# Patient Record
Sex: Female | Born: 1980 | Race: Black or African American | Hispanic: No | Marital: Single | Smoking: Never smoker
Health system: Southern US, Community
[De-identification: ages and names within clinical notes are randomized; demographics above are authoritative.]

## PROBLEM LIST (undated history)

## (undated) DIAGNOSIS — M549 Dorsalgia, unspecified: Secondary | ICD-10-CM

## (undated) HISTORY — PX: TUBAL LIGATION: SHX77

---

## 2017-02-12 ENCOUNTER — Emergency Department (HOSPITAL_BASED_OUTPATIENT_CLINIC_OR_DEPARTMENT_OTHER)
Admission: EM | Admit: 2017-02-12 | Discharge: 2017-02-12 | Disposition: A | Discharge status: Home / Self Care | Payer: Medicaid Other | Attending: Emergency Medicine | Admitting: Emergency Medicine

## 2017-02-12 ENCOUNTER — Emergency Department (HOSPITAL_BASED_OUTPATIENT_CLINIC_OR_DEPARTMENT_OTHER): Payer: Medicaid Other

## 2017-02-12 ENCOUNTER — Encounter (HOSPITAL_BASED_OUTPATIENT_CLINIC_OR_DEPARTMENT_OTHER): Payer: Self-pay | Admitting: Adult Health

## 2017-02-12 DIAGNOSIS — M5442 Lumbago with sciatica, left side: Secondary | ICD-10-CM | POA: Diagnosis not present

## 2017-02-12 DIAGNOSIS — M545 Low back pain: Secondary | ICD-10-CM | POA: Diagnosis present

## 2017-02-12 DIAGNOSIS — M5441 Lumbago with sciatica, right side: Secondary | ICD-10-CM | POA: Insufficient documentation

## 2017-02-12 DIAGNOSIS — N390 Urinary tract infection, site not specified: Secondary | ICD-10-CM | POA: Insufficient documentation

## 2017-02-12 HISTORY — DX: Dorsalgia, unspecified: M54.9

## 2017-02-12 LAB — URINALYSIS, ROUTINE W REFLEX MICROSCOPIC
Bilirubin Urine: NEGATIVE
GLUCOSE, UA: NEGATIVE mg/dL
HGB URINE DIPSTICK: NEGATIVE
Ketones, ur: NEGATIVE mg/dL
Nitrite: NEGATIVE
PROTEIN: NEGATIVE mg/dL
Specific Gravity, Urine: 1.018 (ref 1.005–1.030)
pH: 5.5 (ref 5.0–8.0)

## 2017-02-12 LAB — URINALYSIS, MICROSCOPIC (REFLEX)

## 2017-02-12 LAB — PREGNANCY, URINE: Preg Test, Ur: NEGATIVE

## 2017-02-12 MED ORDER — CEPHALEXIN 500 MG PO CAPS: 500.0000 mg | ORAL_CAPSULE | Freq: Two times a day (BID) | ORAL | 0 refills | Status: DC

## 2017-02-12 MED ORDER — METHYLPREDNISOLONE SODIUM SUCC 125 MG IJ SOLR
80.0000 mg | Freq: Once | INTRAMUSCULAR | Status: AC
  Administered 2017-02-12: 80 mg via INTRAMUSCULAR
  Filled 2017-02-12: qty 2

## 2017-02-12 MED ORDER — METHOCARBAMOL 500 MG PO TABS: 500.0000 mg | ORAL_TABLET | Freq: Two times a day (BID) | ORAL | 0 refills | Status: AC

## 2017-02-12 MED ORDER — CEPHALEXIN 500 MG PO CAPS: 500.0000 mg | ORAL_CAPSULE | Freq: Two times a day (BID) | ORAL | 0 refills | Status: AC

## 2017-02-12 MED ORDER — ACETAMINOPHEN 500 MG PO TABS: 500.0000 mg | ORAL_TABLET | Freq: Four times a day (QID) | ORAL | 0 refills | Status: AC | PRN

## 2017-02-12 MED ORDER — PREDNISONE 20 MG PO TABS: 40.0000 mg | ORAL_TABLET | Freq: Every day | ORAL | 0 refills | Status: AC

## 2017-02-12 MED ORDER — IBUPROFEN 600 MG PO TABS: 600.0000 mg | ORAL_TABLET | Freq: Four times a day (QID) | ORAL | 0 refills | Status: AC | PRN

## 2017-02-12 MED ORDER — KETOROLAC TROMETHAMINE 30 MG/ML IJ SOLN
30.0000 mg | Freq: Once | INTRAMUSCULAR | Status: AC
  Administered 2017-02-12: 30 mg via INTRAMUSCULAR
  Filled 2017-02-12: qty 1

## 2017-02-12 MED FILL — MAPAP 500 MG TABLET: 500 | 25 days supply | Qty: 100 | Fill #0

## 2017-02-12 MED FILL — IBUPROFEN 600 MG TABLET: 600 | 8 days supply | Qty: 30 | Fill #0

## 2017-02-12 MED FILL — predniSONE 20 MG TABS: 20 | 4 days supply | Qty: 8 | Fill #0

## 2017-02-12 MED FILL — METHOCARBAMOL 500 MG TABLET: 500 | 10 days supply | Qty: 20 | Fill #0

## 2017-02-12 MED FILL — CEPHALEXIN 500 MG CAPSULE: 500 | 7 days supply | Qty: 14 | Fill #0

## 2017-02-12 NOTE — ED Triage Notes (Addendum)
PResents with onset of back pain in lower back and into both buttocks. LASt night reports she was fine and had her normal back ache that hurts a little is worse in the AM but once getting moving it is better, this AM the pain was severe and she was unable to walk with out severe pain in both hips, lower back and buttocks. Denies numbness and tingling, pain is described as "someone took my body and snapped it" denies loss of bowel and bladder.  ENdorses frequent urination.

## 2017-02-12 NOTE — ED Notes (Signed)
Patient transported to X-ray 

## 2017-02-12 NOTE — ED Notes (Signed)
ED Provider at bedside. 

## 2017-02-12 NOTE — ED Provider Notes (Signed)
[facility] Provider [facility]   CSN: [id number] Arrival date & time: 02/12/17  1401     History   Chief Complaint Chief Complaint  Patient presents with  . Back Pain    HPI Brianna Floyd is a 36 y.o. female with history of mild intermittent back pain who presents with severe back pain and began this morning. Patient reports bilateral and midline low back pain with radiation to her hips and pain radiation down the back of her legs when she walks. Patient where she could not walk this morning due to the severe pain. However, patient did ambulate in ED. Patient also reports urinary frequency and urgency over the past few months. She denies loss of bowel or bladder control or saddle anesthesia, however. She also denies any fevers, weight loss, history of procedure to back, history of IVDU, cancer, or any other concerning factor. She denies any numbness or tingling, chest pain, shortness of breath, abdominal pain, nausea, vomiting. Patient states she normally takes Tylenol for her back pain, but did not take anything today prior to arrival.  HPI  Past Medical History:  Diagnosis Date  . Back ache     There are no active problems to display for this patient.   History reviewed. No pertinent surgical history.  OB History    No data available       Home Medications    Prior to Admission medications   Medication Sig Start Date End Date Taking? Authorizing Provider  acetaminophen (TYLENOL) 500 MG tablet Take 1 tablet (500 mg total) by mouth every 6 (six) hours as needed. 02/12/17   Medrith Veillon, Waylan BogaAlexandra M, PA-C  cephALEXin (KEFLEX) 500 MG capsule Take 1 capsule (500 mg total) by mouth 2 (two) times daily. 02/12/17   Oyuki Hogan, Waylan BogaAlexandra M, PA-C  ibuprofen (ADVIL,MOTRIN) 600 MG tablet Take 1 tablet (600 mg total) by mouth every 6 (six) hours as needed. 02/12/17   Odena Mcquaid, Waylan BogaAlexandra M, PA-C  methocarbamol (ROBAXIN) 500 MG tablet Take 1 tablet (500 mg total) by mouth 2 (two) times daily. 02/12/17    Johathon Overturf, Waylan BogaAlexandra M, PA-C  predniSONE (DELTASONE) 20 MG tablet Take 2 tablets (40 mg total) by mouth daily with breakfast. 02/12/17   Emi HolesLaw, Addi Pak M, PA-C    Family History History reviewed. No pertinent family history.  Social History Social History  Substance Use Topics  . Smoking status: Never Smoker  . Smokeless tobacco: Never Used  . Alcohol use No     Allergies   Patient has no known allergies.   Review of Systems Review of Systems  Constitutional: Negative for chills and fever.  HENT: Negative for facial swelling and sore throat.   Respiratory: Negative for shortness of breath.   Cardiovascular: Negative for chest pain.  Gastrointestinal: Negative for abdominal pain, nausea and vomiting.  Genitourinary: Positive for frequency and urgency. Negative for dysuria.  Musculoskeletal: Positive for back pain.  Skin: Negative for rash and wound.  Neurological: Negative for numbness and headaches.  Psychiatric/Behavioral: The patient is not nervous/anxious.      Physical Exam Updated Vital Signs BP 134/75 (BP Location: Right Arm)   Pulse 100   Temp 98.3 F (36.8 C) (Oral)   Resp 20   Ht 5\' 5"  (1.651 m)   Wt 90.7 kg (200 lb)   LMP 01/15/2017 (Approximate)   SpO2 100%   BMI 33.28 kg/m   Physical Exam  Constitutional: She appears well-developed and well-nourished. No distress.  HENT:  Head: Normocephalic and atraumatic.  Mouth/Throat: Oropharynx is clear and moist. No oropharyngeal exudate.  Eyes: Conjunctivae are normal. Pupils are equal, round, and reactive to light. Right eye exhibits no discharge. Left eye exhibits no discharge. No scleral icterus.  Neck: Normal range of motion. Neck supple. No thyromegaly present.  Cardiovascular: Normal rate, regular rhythm, normal heart sounds and intact distal pulses.  Exam reveals no gallop and no friction rub.   No murmur heard. Pulmonary/Chest: Effort normal and breath sounds normal. No stridor. No respiratory distress. She  has no wheezes. She has no rales.  Abdominal: Soft. Bowel sounds are normal. She exhibits no distension. There is no tenderness. There is no rebound and no guarding.  Musculoskeletal: She exhibits no edema.       Cervical back: She exhibits no tenderness and no bony tenderness.       Thoracic back: She exhibits no tenderness and no bony tenderness.       Lumbar back: She exhibits tenderness and bony tenderness.       Back:  Positive straight leg raise, worse on the right  Lymphadenopathy:    She has no cervical adenopathy.  Neurological: She is alert. Coordination normal.  5/5 strength to lower extremities; normal sensation  Skin: Skin is warm and dry. No rash noted. She is not diaphoretic. No pallor.  Psychiatric: She has a normal mood and affect.  Nursing note and vitals reviewed.    ED Treatments / Results  Labs (all labs ordered are listed, but only abnormal results are displayed) Labs Reviewed  URINALYSIS, ROUTINE W REFLEX MICROSCOPIC - Abnormal; Notable for the following:       Result Value   APPearance CLOUDY (*)    Leukocytes, UA SMALL (*)    All other components within normal limits  URINALYSIS, MICROSCOPIC (REFLEX) - Abnormal; Notable for the following:    Bacteria, UA MANY (*)    Squamous Epithelial / LPF 0-5 (*)    All other components within normal limits  URINE CULTURE  PREGNANCY, URINE    EKG  EKG Interpretation None       Radiology Dg Lumbar Spine Complete  Result Date: 02/12/2017 CLINICAL DATA:  Low back pain common no known injury, initial encounter EXAM: LUMBAR SPINE - COMPLETE 4+ VIEW COMPARISON:  None. FINDINGS: Five lumbar type vertebral bodies are well visualized. Vertebral body height is well maintained. No compression fractures are seen. No anterolisthesis is noted. No soft tissue changes are seen. IMPRESSION: No acute abnormality noted. Electronically Signed   By: Alcide Clever M.D.   On: 02/12/2017 15:01    Procedures Procedures (including  critical care time)  Medications Ordered in ED Medications  ketorolac (TORADOL) 30 MG/ML injection 30 mg (30 mg Intramuscular Given 02/12/17 1458)  methylPREDNISolone sodium succinate (SOLU-MEDROL) 125 mg/2 mL injection 80 mg (80 mg Intramuscular Given 02/12/17 1458)     Initial Impression / Assessment and Plan / ED Course  I have reviewed the triage vital signs and the nursing notes.  Pertinent labs & imaging results that were available during my care of the patient were reviewed by me and considered in my medical decision making (see chart for details).     Patient with back pain.  Lumbar x-ray shows no acute abnormality. No neurological deficits and normal neuro exam.  Patient is ambulatory.  No loss of bowel or bladder control.  No concern for cauda equina.  No fever, night sweats, weight loss, h/o cancer, IVDA, no recent procedure to back. Patient with small leukocytes, many  bacteria, and urinary frequency. We'll treat with Keflex. Urine culture sent. Patient's back pain improved significantly with IM Toradol and Solu-Medrol in the ED. Supportive care, including ibuprofen, Robaxin, 4 day burst of prednisone, and return precaution discussed. Appears safe for discharge at this time. Follow up with Dr. Pearletha Forge as indicated in discharge paperwork.    Final Clinical Impressions(s) / ED Diagnoses   Final diagnoses:  Acute bilateral low back pain with bilateral sciatica  Lower urinary tract infectious disease    New Prescriptions Discharge Medication List as of 02/12/2017  4:32 PM    START taking these medications   Details  acetaminophen (TYLENOL) 500 MG tablet Take 1 tablet (500 mg total) by mouth every 6 (six) hours as needed., Starting Tue 02/12/2017, Print    ibuprofen (ADVIL,MOTRIN) 600 MG tablet Take 1 tablet (600 mg total) by mouth every 6 (six) hours as needed., Starting Tue 02/12/2017, Print    methocarbamol (ROBAXIN) 500 MG tablet Take 1 tablet (500 mg total) by mouth 2 (two) times  daily., Starting Tue 02/12/2017, Print    predniSONE (DELTASONE) 20 MG tablet Take 2 tablets (40 mg total) by mouth daily with breakfast., Starting Tue 02/12/2017, Print    cephALEXin (KEFLEX) 500 MG capsule Take 1 capsule (500 mg total) by mouth 2 (two) times daily., Starting Tue 02/12/2017, Print         Machelle Raybon, [place], PA-C 02/12/17 1742    Benjiman Core, MD 02/13/17 [phone]

## 2017-02-12 NOTE — Discharge Instructions (Addendum)
Medications: Prednisone, Robaxin, ibuprofen, Tylenol, Keflex  Treatment: Take prednisone once daily for 4 days. Take Robaxin twice daily as needed for muscle pain and spasms. Do not drive or operate machinery while taking Robaxin. Alternate ibuprofen and Tylenol every 3 hours or take 1 of the 2 every 6 hours. Take Keflex twice daily for 7 days for suspected urinary tract infection. Use ice 3-4 times daily alternating 20 minutes on, 20 minutes off. You also alternate with heating pad.  Follow-up: Please follow-up with Dr. Pearletha ForgeHudnall, a sports medicine doctor, for further evaluation and treatment of your back pain. Please return to emergency department if you develop any new or worsening symptoms.

## 2017-02-13 LAB — URINE CULTURE: Special Requests: NORMAL

## 2017-05-05 ENCOUNTER — Emergency Department (HOSPITAL_BASED_OUTPATIENT_CLINIC_OR_DEPARTMENT_OTHER)
Admission: EM | Admit: 2017-05-05 | Discharge: 2017-05-05 | Disposition: A | Discharge status: Home / Self Care | Payer: Medicaid Other | Attending: Physician Assistant | Admitting: Physician Assistant

## 2017-05-05 ENCOUNTER — Encounter (HOSPITAL_BASED_OUTPATIENT_CLINIC_OR_DEPARTMENT_OTHER): Payer: Self-pay | Admitting: Emergency Medicine

## 2017-05-05 DIAGNOSIS — K047 Periapical abscess without sinus: Secondary | ICD-10-CM | POA: Diagnosis not present

## 2017-05-05 DIAGNOSIS — K0889 Other specified disorders of teeth and supporting structures: Secondary | ICD-10-CM | POA: Diagnosis present

## 2017-05-05 DIAGNOSIS — Z79899 Other long term (current) drug therapy: Secondary | ICD-10-CM | POA: Insufficient documentation

## 2017-05-05 MED ORDER — TRAMADOL HCL 50 MG PO TABS: 50.0000 mg | ORAL_TABLET | Freq: Four times a day (QID) | ORAL | 0 refills | Status: AC | PRN

## 2017-05-05 MED ORDER — BENZOCAINE 20 % MT AERO
INHALATION_SPRAY | OROMUCOSAL | Status: AC
  Filled 2017-05-05: qty 57

## 2017-05-05 MED ORDER — CLINDAMYCIN HCL 300 MG PO CAPS: 300.0000 mg | ORAL_CAPSULE | Freq: Three times a day (TID) | ORAL | 0 refills | Status: AC

## 2017-05-05 MED ORDER — LIDOCAINE VISCOUS 2 % MT SOLN: 20.0000 mL | OROMUCOSAL | 0 refills | Status: AC | PRN

## 2017-05-05 NOTE — ED Notes (Signed)
ED Provider at bedside. 

## 2017-05-05 NOTE — ED Notes (Signed)
Left upper tooth broke off a month ago and it did not bother her.  Gums to affected site is slightly swollen.  Patient claimed that it also hurts her left side of her head.

## 2017-05-05 NOTE — Discharge Instructions (Signed)
You do have a dental abscess. Please take the antibiotic.Follow up with a dentist. Motrin and tylenol for pain. Use the viscous lidocaine. Have given you small amount of tramadol to take for refractory pain. REturn to the Ed if symptoms worsen.

## 2017-05-05 NOTE — ED Triage Notes (Signed)
Pt c/o dental pain x 2 days; sts broken tooth on LT upper gum now has an abscess.

## 2017-05-06 NOTE — ED Provider Notes (Signed)
[facility]-EMER[facility]ENCY DEPT Provider Note   CSN: [id number] Arrival date & time: 05/05/17  1425     History   Chief Complaint Chief Complaint  Patient presents with  . Dental Pain    HPI Brianna Floyd is a 36 y.o. female.  HPI 36 year old [place] American female presents to the ED with complaints of tooth abscess. Patient states that her left upper tooth broke off one month ago. At that time it was not bothering her. Patient states that since then she has developed swelling to the area and significant pain. States the pain radiates to the left side of her face. Patient denies any associated difficulty swallowing, difficulties breathing, fevers, facial swelling. Patient follow-up with a dentist yet. She denies any associated lightheadedness, dizziness, vision changes, neck pain. Patient has not tried anything for her symptoms prior to arrival. Nothing makes better. Eating and drinking makes the pain worse. Past Medical History:  Diagnosis Date  . Back ache     There are no active problems to display for this patient.   Past Surgical History:  Procedure Laterality Date  . TUBAL LIGATION      OB History    No data available       Home Medications    Prior to Admission medications   Medication Sig Start Date End Date Taking? Authorizing Provider  acetaminophen (TYLENOL) 500 MG tablet Take 1 tablet (500 mg total) by mouth every 6 (six) hours as needed. 02/12/17   Law, Waylan Boga, PA-C  cephALEXin (KEFLEX) 500 MG capsule Take 1 capsule (500 mg total) by mouth 2 (two) times daily. 02/12/17   Law, Waylan Boga, PA-C  clindamycin (CLEOCIN) 300 MG capsule Take 1 capsule (300 mg total) by mouth 3 (three) times daily. 05/05/17   Rise Mu, PA-C  ibuprofen (ADVIL,MOTRIN) 600 MG tablet Take 1 tablet (600 mg total) by mouth every 6 (six) hours as needed. 02/12/17   Law, Waylan Boga, PA-C  lidocaine (XYLOCAINE) 2 % solution Use as directed 20 mLs in the mouth or throat as needed for  mouth pain. 05/05/17   Rise Mu, PA-C  methocarbamol (ROBAXIN) 500 MG tablet Take 1 tablet (500 mg total) by mouth 2 (two) times daily. 02/12/17   Law, Waylan Boga, PA-C  predniSONE (DELTASONE) 20 MG tablet Take 2 tablets (40 mg total) by mouth daily with breakfast. 02/12/17   Law, Waylan Boga, PA-C  traMADol (ULTRAM) 50 MG tablet Take 1 tablet (50 mg total) by mouth every 6 (six) hours as needed. 05/05/17   Rise Mu, PA-C    Family History No family history on file.  Social History Social History  Substance Use Topics  . Smoking status: Never Smoker  . Smokeless tobacco: Never Used  . Alcohol use No     Allergies   Patient has no known allergies.   Review of Systems Review of Systems  Constitutional: Negative for chills and fever.  HENT: Negative for facial swelling and trouble swallowing.   Eyes: Negative for visual disturbance.  Gastrointestinal: Negative for nausea and vomiting.  Musculoskeletal: Negative for neck pain and neck stiffness.  Skin: Negative for color change.  Neurological: Positive for headaches. Negative for dizziness, weakness, light-headedness and numbness.     Physical Exam Updated Vital Signs BP (!) 136/55 (BP Location: Left Arm)   Pulse 90   Temp 98.5 F (36.9 C) (Oral)   Resp 18   LMP 04/14/2017   SpO2 100%   Physical Exam  Constitutional: She appears  well-developed and well-nourished. No distress.  HENT:  Head: Normocephalic and atraumatic.  Mouth/Throat: Uvula is midline, oropharynx is clear and moist and mucous membranes are normal. No trismus in the jaw. No uvula swelling.    No facial swelling present. Oropharynx is clear. No sublingual or submandibular swelling. Speaking complete sentences, managing her secretions, maintaining her airway.  Eyes: Right eye exhibits no discharge. Left eye exhibits no discharge. No scleral icterus.  Neck: Normal range of motion. Neck supple. No tracheal deviation present.    Pulmonary/Chest: No respiratory distress.  Musculoskeletal: Normal range of motion.  Lymphadenopathy:    She has no cervical adenopathy.  Neurological: She is alert.  Skin: No pallor.  Psychiatric: Her behavior is normal. Judgment and thought content normal.  Nursing note and vitals reviewed.    ED Treatments / Results  Labs (all labs ordered are listed, but only abnormal results are displayed) Labs Reviewed - No data to display  EKG  EKG Interpretation None       Radiology No results found.  Procedures .Marland KitchenIncision and Drainage Date/Time: 05/06/2017 10:52 AM Performed by: Demetrios Loll T Authorized by: Demetrios Loll T   Consent:    Consent obtained:  Verbal   Consent given by:  Patient   Risks discussed:  Bleeding, damage to other organs, infection, pain and incomplete drainage   Alternatives discussed:  No treatment Location:    Type:  Abscess   Size:  1cm   Location:  Mouth   Mouth location:  Alveolar process Anesthesia (see MAR for exact dosages):    Anesthesia method:  Topical application   Topical anesthesia: Hurricaine spray. Procedure type:    Complexity:  Simple Procedure details:    Needle aspiration: yes     Needle size:  18 G   Incision depth:  Submucosal   Drainage:  Purulent   Drainage amount:  Moderate   Wound treatment:  Wound left open   Packing materials:  None Post-procedure details:    Patient tolerance of procedure:  Tolerated well, no immediate complications   (including critical care time)  Medications Ordered in ED Medications - No data to display   Initial Impression / Assessment and Plan / ED Course  I have reviewed the triage vital signs and the nursing notes.  Pertinent labs & imaging results that were available during my care of the patient were reviewed by me and considered in my medical decision making (see chart for details).     Patient with toothache. Abscess present and was drained in the ED with moderate  amount of purulent drainage. Patient felt much improved after I&D. Exam unconcerning for Ludwig's angina or spread of infection.  Will treat with clindamycin and pain medicine.  Urged patient to follow-up with dentist.     Final Clinical Impressions(s) / ED Diagnoses   Final diagnoses:  Dental abscess    New Prescriptions Discharge Medication List as of 05/05/2017  4:59 PM    START taking these medications   Details  clindamycin (CLEOCIN) 300 MG capsule Take 1 capsule (300 mg total) by mouth 3 (three) times daily., Starting Sun 05/05/2017, Print    lidocaine (XYLOCAINE) 2 % solution Use as directed 20 mLs in the mouth or throat as needed for mouth pain., Starting Sun 05/05/2017, Print         Rise Mu, PA-C 05/06/17 1054    [place], Cindee Salt, MD 05/09/17 1505

## 2018-08-09 IMAGING — CR DG LUMBAR SPINE COMPLETE 4+V
5 series · 5 of 5 positions shown · non-contrast
Comparison: None.

CLINICAL DATA: Low back pain common no known injury, initial
encounter

EXAM:
LUMBAR SPINE - COMPLETE 4+ VIEW

[t l-spine a.p.]
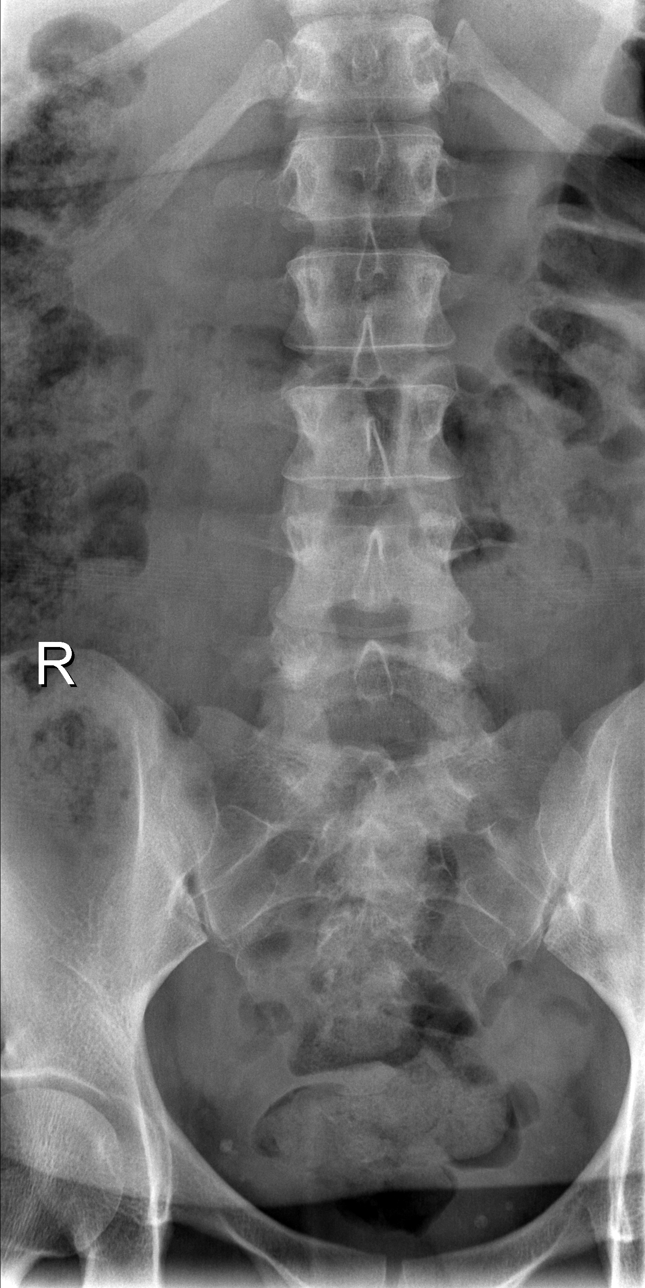

[t l-spine oblique exposure (1 of 2)]
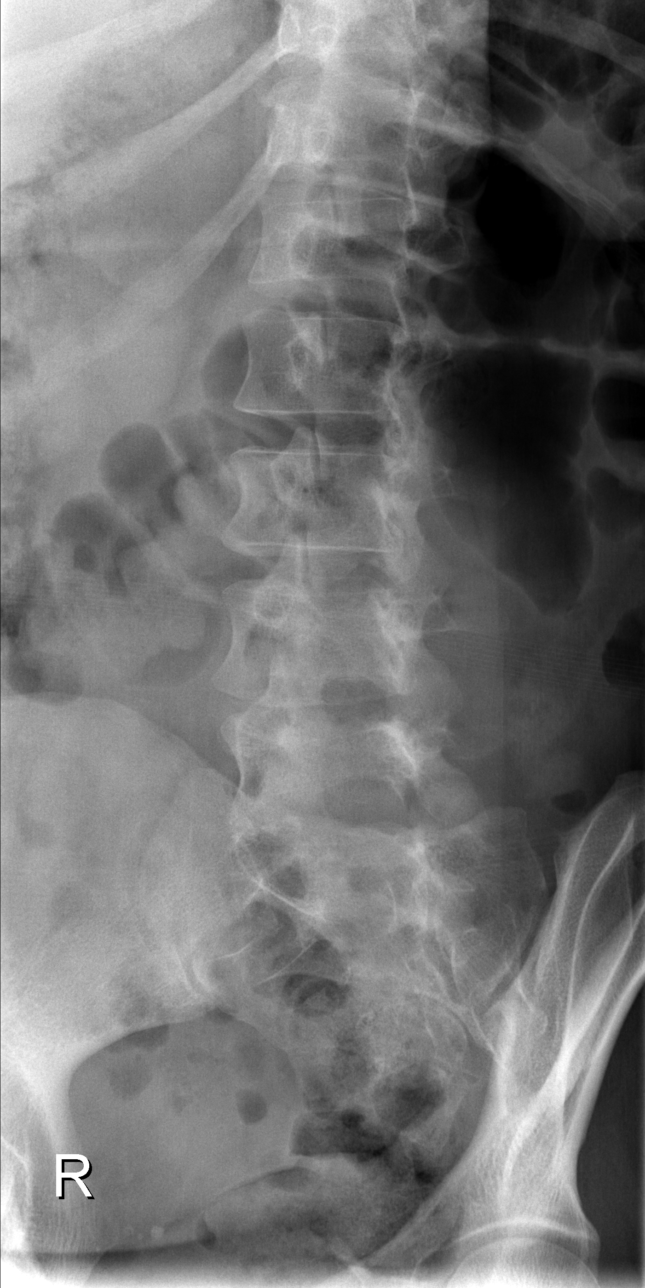

[t l-spine oblique exposure (2 of 2)]
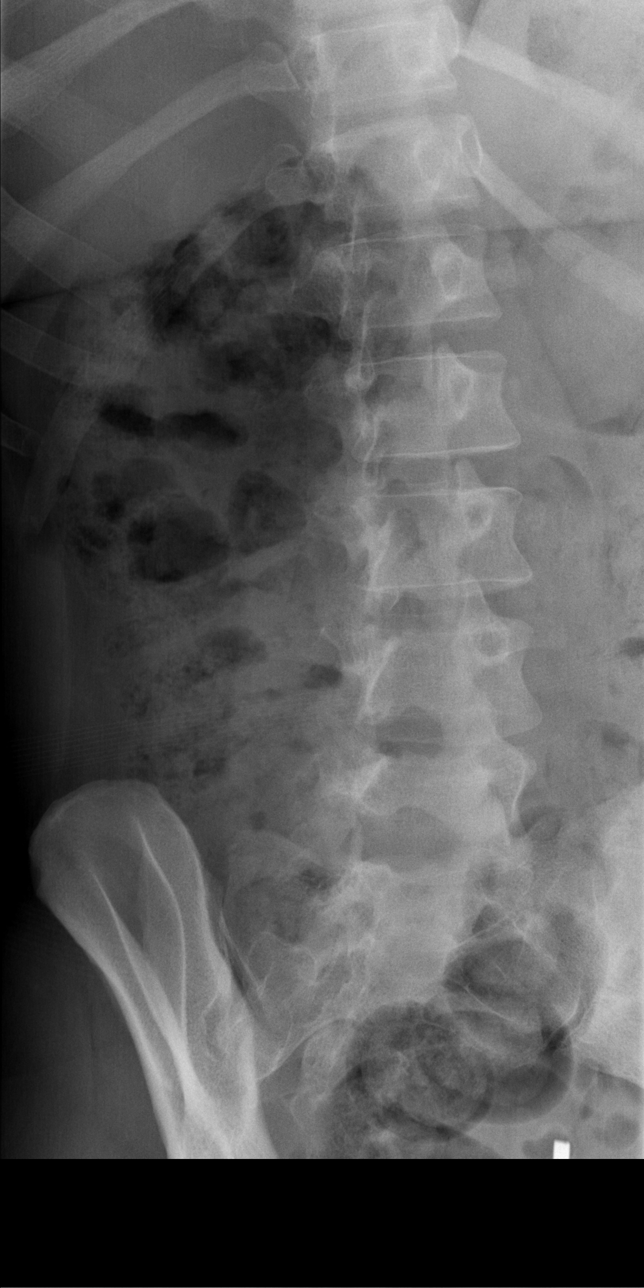

[t l-spine lat]
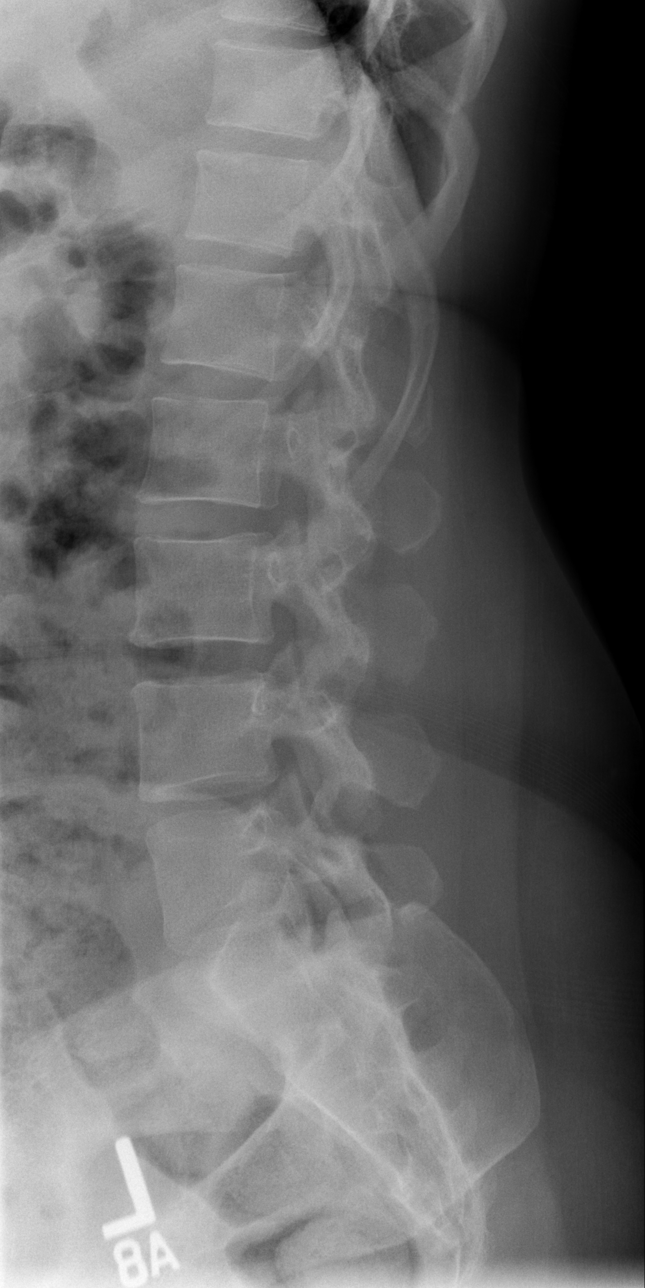

[t l-spine l5-s1 spot]
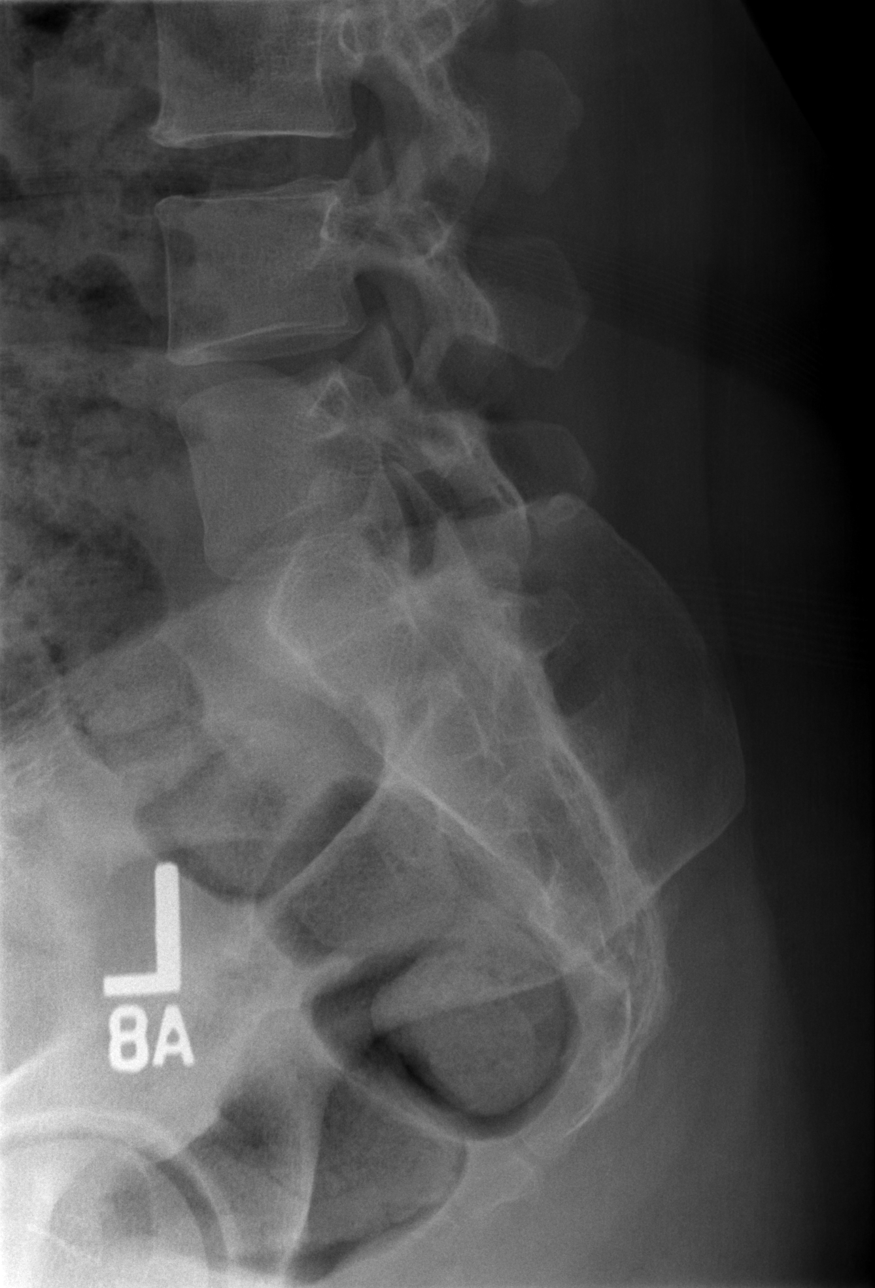

[5 of 5 positions shown; findings below may reference images not displayed]

FINDINGS: Five lumbar type vertebral bodies are well visualized. Vertebral
body height is well maintained. No compression fractures are seen.
No anterolisthesis is noted. No soft tissue changes are seen.
IMPRESSION: No acute abnormality noted.

## 2019-09-13 ENCOUNTER — Encounter (HOSPITAL_COMMUNITY): Payer: Self-pay | Admitting: Student

## 2019-09-13 ENCOUNTER — Emergency Department (HOSPITAL_COMMUNITY): Payer: Medicaid Other

## 2019-09-13 ENCOUNTER — Other Ambulatory Visit: Payer: Self-pay

## 2019-09-13 ENCOUNTER — Emergency Department (HOSPITAL_COMMUNITY)
Admission: EM | Admit: 2019-09-13 | Discharge: 2019-09-13 | Disposition: A | Discharge status: Home / Self Care | Payer: Medicaid Other | Attending: Emergency Medicine | Admitting: Emergency Medicine

## 2019-09-13 DIAGNOSIS — Z79899 Other long term (current) drug therapy: Secondary | ICD-10-CM | POA: Insufficient documentation

## 2019-09-13 DIAGNOSIS — R0981 Nasal congestion: Secondary | ICD-10-CM | POA: Insufficient documentation

## 2019-09-13 DIAGNOSIS — R05 Cough: Secondary | ICD-10-CM | POA: Insufficient documentation

## 2019-09-13 DIAGNOSIS — R438 Other disturbances of smell and taste: Secondary | ICD-10-CM | POA: Insufficient documentation

## 2019-09-13 DIAGNOSIS — R0602 Shortness of breath: Secondary | ICD-10-CM

## 2019-09-13 DIAGNOSIS — U071 COVID-19: Secondary | ICD-10-CM | POA: Insufficient documentation

## 2019-09-13 MED ORDER — AEROCHAMBER PLUS FLO-VU LARGE MISC
1.0000 | Freq: Once | Status: AC
  Administered 2019-09-13: 1

## 2019-09-13 MED ORDER — ALBUTEROL SULFATE HFA 108 (90 BASE) MCG/ACT IN AERS
2.0000 | INHALATION_SPRAY | Freq: Once | RESPIRATORY_TRACT | Status: AC
  Administered 2019-09-13: 2 via RESPIRATORY_TRACT
  Filled 2019-09-13: qty 6.7

## 2019-09-13 NOTE — Discharge Instructions (Addendum)
You were seen in the ER today for trouble breathing. Your chest xray and EKG were reassuring.  Your oxygen levels were normal.  We are sending you home with an albuterol inhaler to use 1-2 puffs every 4-6 hours as needed for trouble breathing or wheezing.   We have prescribed you new medication(s) today. Discuss the medications prescribed today with your pharmacist as they can have adverse effects and interactions with your other medicines including over the counter and prescribed medications. Seek medical evaluation if you start to experience new or abnormal symptoms after taking one of these medicines, seek care immediately if you start to experience difficulty breathing, feeling of your throat closing, facial swelling, or rash as these could be indications of a more serious allergic reaction  We are instructing patient's with COVID 19 or symptoms of COVID 19 to quarantine themselves for 14 days. You may be able to discontinue self quarantine if the following conditions are met:   Persons with COVID-19 who have symptoms and were directed to care for themselves at home may discontinue home isolation under the  following conditions: - It has been at least 7 days have passed since symptoms first appeared. - AND at least 3 days (72 hours) have passed since recovery defined as resolution of fever without the use of fever-reducing medications and improvement in respiratory symptoms (e.g., cough, shortness of breath)  Please follow the below quarantine instructions.   Please follow up with primary care within 3-5 days for re-evaluation- call prior to going to the office to make them aware of your symptoms as some offices are altering their method of seeing patients with COVID 19 symptoms. Return to the ER for new or worsening symptoms including but not limited to increased work of breathing, chest pain, coughing up blood, passing out, or any other concerns.      Person Under Monitoring Name: Brianna Floyd  Location: [place] [address]   Infection Prevention Recommendations for Individuals Confirmed to have, or Being Evaluated for, 2019 Novel Coronavirus (COVID-19) Infection Who Receive Care at Home  Individuals who are confirmed to have, or are being evaluated for, COVID-19 should follow the prevention steps below until a healthcare provider or local or state health department says they can return to normal activities.  Stay home except to get medical care You should restrict activities outside your home, except for getting medical care. Do not go to work, school, or public areas, and do not use public transportation or taxis.  Call ahead before visiting your doctor Before your medical appointment, call the healthcare provider and tell them that you have, or are being evaluated for, COVID-19 infection. This will help the healthcare provider's office take steps to keep other people from getting infected. Ask your healthcare provider to call the local or state health department.  Monitor your symptoms Seek prompt medical attention if your illness is worsening (e.g., difficulty breathing). Before going to your medical appointment, call the healthcare provider and tell them that you have, or are being evaluated for, COVID-19 infection. Ask your healthcare provider to call the local or state health department.  Wear a facemask You should wear a facemask that covers your nose and mouth when you are in the same room with other people and when you visit a healthcare provider. People who live with or visit you should also wear a facemask while they are in the same room with you.  Separate yourself from other people in your home  As much as possible, you should stay in a different room from other people in your home. Also, you should use a separate bathroom, if available.  Avoid sharing household items You should not share dishes, drinking glasses, cups, eating  utensils, towels, bedding, or other items with other people in your home. After using these items, you should wash them thoroughly with soap and water.  Cover your coughs and sneezes Cover your mouth and nose with a tissue when you cough or sneeze, or you can cough or sneeze into your sleeve. Throw used tissues in a lined trash can, and immediately wash your hands with soap and water for at least 20 seconds or use an alcohol-based hand rub.  Wash your [facility] your hands often and thoroughly with soap and water for at least 20 seconds. You can use an alcohol-based hand sanitizer if soap and water are not available and if your hands are not visibly dirty. Avoid touching your eyes, nose, and mouth with unwashed hands.   Prevention Steps for Caregivers and Household Members of Individuals Confirmed to have, or Being Evaluated for, COVID-19 Infection Being Cared for in the Home  If you live with, or provide care at home for, a person confirmed to have, or being evaluated for, COVID-19 infection please follow these guidelines to prevent infection:  Follow healthcare provider's instructions Make sure that you understand and can help the patient follow any healthcare provider instructions for all care.  Provide for the patient's basic needs You should help the patient with basic needs in the home and provide support for getting groceries, prescriptions, and other personal needs.  Monitor the patient's symptoms If they are getting sicker, call his or her medical provider and tell them that the patient has, or is being evaluated for, COVID-19 infection. This will help the healthcare provider's office take steps to keep other people from getting infected. Ask the healthcare provider to call the local or state health department.  Limit the number of people who have contact with the patient If possible, have only one caregiver for the patient. Other household members should stay in another  home or place of residence. If this is not possible, they should stay in another room, or be separated from the patient as much as possible. Use a separate bathroom, if available. Restrict visitors who do not have an essential need to be in the home.  Keep older adults, very young children, and other sick people away from the patient Keep older adults, very young children, and those who have compromised immune systems or chronic health conditions away from the patient. This includes people with chronic heart, lung, or kidney conditions, diabetes, and cancer.  Ensure good ventilation Make sure that shared spaces in the home have good air flow, such as from an air conditioner or an opened window, weather permitting.  Wash your hands often Wash your hands often and thoroughly with soap and water for at least 20 seconds. You can use an alcohol based hand sanitizer if soap and water are not available and if your hands are not visibly dirty. Avoid touching your eyes, nose, and mouth with unwashed hands. Use disposable paper towels to dry your hands. If not available, use dedicated cloth towels and replace them when they become wet.  Wear a facemask and gloves Wear a disposable facemask at all times in the room and gloves when you touch or have contact with the patient's blood, body fluids, and/or secretions or excretions, such  as sweat, saliva, sputum, nasal mucus, vomit, urine, or feces.  Ensure the mask fits over your nose and mouth tightly, and do not touch it during use. Throw out disposable facemasks and gloves after using them. Do not reuse. Wash your hands immediately after removing your facemask and gloves. If your personal clothing becomes contaminated, carefully remove clothing and launder. Wash your hands after handling contaminated clothing. Place all used disposable facemasks, gloves, and other waste in a lined container before disposing them with other household waste. Remove gloves and  wash your hands immediately after handling these items.  Do not share dishes, glasses, or other household items with the patient Avoid sharing household items. You should not share dishes, drinking glasses, cups, eating utensils, towels, bedding, or other items with a patient who is confirmed to have, or being evaluated for, COVID-19 infection. After the person uses these items, you should wash them thoroughly with soap and water.  Wash laundry thoroughly Immediately remove and wash clothes or bedding that have blood, body fluids, and/or secretions or excretions, such as sweat, saliva, sputum, nasal mucus, vomit, urine, or feces, on them. Wear gloves when handling laundry from the patient. Read and follow directions on labels of laundry or clothing items and detergent. In general, wash and dry with the warmest temperatures recommended on the label.  Clean all areas the individual has used often Clean all touchable surfaces, such as counters, tabletops, doorknobs, bathroom fixtures, toilets, phones, keyboards, tablets, and bedside tables, every day. Also, clean any surfaces that may have blood, body fluids, and/or secretions or excretions on them. Wear gloves when cleaning surfaces the patient has come in contact with. Use a diluted bleach solution (e.g., dilute bleach with 1 part bleach and 10 parts water) or a household disinfectant with a label that says EPA-registered for coronaviruses. To make a bleach solution at home, add 1 tablespoon of bleach to 1 quart (4 cups) of water. For a larger supply, add  cup of bleach to 1 gallon (16 cups) of water. Read labels of cleaning products and follow recommendations provided on product labels. Labels contain instructions for safe and effective use of the cleaning product including precautions you should take when applying the product, such as wearing gloves or eye protection and making sure you have good ventilation during use of the product. Remove gloves  and wash hands immediately after cleaning.  Monitor yourself for signs and symptoms of illness Caregivers and household members are considered close contacts, should monitor their health, and will be asked to limit movement outside of the home to the extent possible. Follow the monitoring steps for close contacts listed on the symptom monitoring form.   ? If you have additional questions, contact your local health department or call the epidemiologist on call at [phone] (available 24/7). ? This guidance is subject to change. For the most up-to-date guidance from [facility], please refer to their website: [email or web address]

## 2019-09-13 NOTE — ED Provider Notes (Signed)
[facility] EMERGENCY DEPARTMENT Provider Note   CSN: [id number] Arrival date & time: 09/13/19  1502     History Chief Complaint  Patient presents with  . Shortness of Breath    Brianna Floyd is a 39 y.o. female with a history of prior tubal ligation who presents to the ED with complaints of intermittent dyspnea with recent + covid 19 testing 09/10/19. Patient states she started to feel poorly 01/27 with subsequent positive covid 19 testing the next day. She reports loss of taste/smell, chills, nasal congestion, ear popping sensation, cough intermittently productive of clear phelgm sputum, & intermittent dyspnea. Also has had a few loose stools. No alleviating/aggravating factors. Taking elderberry and vitamin C. She states she was concerned about her breathing which prompted ED visit. Denies fever, N/V, abdominal pain, or chest pain. Denies leg pain/swelling, hemoptysis, recent surgery/trauma, recent long travel, hormone use, personal hx of cancer, or hx of DVT/PE.   HPI     Past Medical History:  Diagnosis Date  . Back ache     There are no problems to display for this patient.   Past Surgical History:  Procedure Laterality Date  . TUBAL LIGATION       OB History   No obstetric history on file.     History reviewed. No pertinent family history.  Social History   Tobacco Use  . Smoking status: Never Smoker  . Smokeless tobacco: Never Used  Substance Use Topics  . Alcohol use: No  . Drug use: Yes    Types: Marijuana    Home Medications Prior to Admission medications   Medication Sig Start Date End Date Taking? Authorizing Provider  acetaminophen (TYLENOL) 500 MG tablet Take 1 tablet (500 mg total) by mouth every 6 (six) hours as needed. 02/12/17   Law, Waylan Boga, PA-C  cephALEXin (KEFLEX) 500 MG capsule Take 1 capsule (500 mg total) by mouth 2 (two) times daily. 02/12/17   Law, Waylan Boga, PA-C  clindamycin (CLEOCIN) 300 MG capsule Take 1  capsule (300 mg total) by mouth 3 (three) times daily. 05/05/17   Rise Mu, PA-C  ibuprofen (ADVIL,MOTRIN) 600 MG tablet Take 1 tablet (600 mg total) by mouth every 6 (six) hours as needed. 02/12/17   Law, Waylan Boga, PA-C  lidocaine (XYLOCAINE) 2 % solution Use as directed 20 mLs in the mouth or throat as needed for mouth pain. 05/05/17   Rise Mu, PA-C  methocarbamol (ROBAXIN) 500 MG tablet Take 1 tablet (500 mg total) by mouth 2 (two) times daily. 02/12/17   Law, Waylan Boga, PA-C  predniSONE (DELTASONE) 20 MG tablet Take 2 tablets (40 mg total) by mouth daily with breakfast. 02/12/17   Law, Waylan Boga, PA-C  traMADol (ULTRAM) 50 MG tablet Take 1 tablet (50 mg total) by mouth every 6 (six) hours as needed. 05/05/17   Rise Mu, PA-C    Allergies    Patient has no known allergies.  Review of Systems   Review of Systems  Constitutional: Positive for chills. Negative for fever.       + for loss of taste/smell.   HENT: Positive for congestion and ear pain (popping, not pain). Negative for sore throat.   Respiratory: Positive for cough and shortness of breath.   Cardiovascular: Negative for chest pain and leg swelling.  Gastrointestinal: Positive for diarrhea. Negative for abdominal pain, nausea and vomiting.  Genitourinary: Positive for vaginal bleeding (currently at the end of menstruation). Negative for dysuria.  Musculoskeletal: Negative for myalgias.  Neurological: Negative for syncope.  All other systems reviewed and are negative.   Physical Exam Updated Vital Signs BP 129/60 (BP Location: Right Arm)   Pulse (!) 112   Temp 98.2 F (36.8 C) (Oral)   Resp 20   SpO2 100%   Physical Exam Vitals and nursing note reviewed.  Constitutional:      General: She is not in acute distress.    Appearance: She is well-developed.  HENT:     Head: Normocephalic and atraumatic.     Right Ear: Ear canal normal. Tympanic membrane is not perforated, erythematous,  retracted or bulging.     Left Ear: Ear canal normal. Tympanic membrane is not perforated, erythematous, retracted or bulging.     Ears:     Comments: No mastoid erythema/swelling/tenderness.     Nose:     Right Sinus: No maxillary sinus tenderness or frontal sinus tenderness.     Left Sinus: No maxillary sinus tenderness or frontal sinus tenderness.     Mouth/Throat:     Pharynx: Uvula midline. No oropharyngeal exudate or posterior oropharyngeal erythema.     Comments: Posterior oropharynx is symmetric appearing. Patient tolerating own secretions without difficulty. No trismus. No drooling. No hot potato voice. No swelling beneath the tongue, submandibular compartment is soft.  Eyes:     General:        Right eye: No discharge.        Left eye: No discharge.     Conjunctiva/sclera: Conjunctivae normal.     Pupils: Pupils are equal, round, and reactive to light.  Cardiovascular:     Rate and Rhythm: Normal rate and regular rhythm.     Heart sounds: No murmur.     Comments: HR 98 on monitor. Pulmonary:     Effort: Pulmonary effort is normal. No respiratory distress.     Breath sounds: Normal breath sounds. No wheezing, rhonchi or rales.     Comments: SpO2 100% on RA @ rest, 98-100% on RA w/ ambulation.  Abdominal:     General: There is no distension.     Palpations: Abdomen is soft.     Tenderness: There is no abdominal tenderness.  Musculoskeletal:     Cervical back: Normal range of motion and neck supple. No edema or rigidity.  Lymphadenopathy:     Cervical: No cervical adenopathy.  Skin:    General: Skin is warm and dry.     Findings: No rash.  Neurological:     Mental Status: She is alert.  Psychiatric:        Behavior: Behavior normal.     ED Results / Procedures / Treatments   Labs (all labs ordered are listed, but only abnormal results are displayed) Labs Reviewed - No data to display  EKG EKG Interpretation  Date/Time:  Sunday September 13 2019 15:19:39  EST Ventricular Rate:  97 PR Interval:    QRS Duration: 89 QT Interval:  363 QTC Calculation: 462 R Axis:   72 Text Interpretation: Sinus rhythm Low voltage, precordial leads Borderline T abnormalities, anterior leads No old tracing to compare Confirmed by Melene Plan [id number]) on 09/13/2019 4:45:07 PM   Radiology DG Chest Port 1 View  Result Date: 09/13/2019 CLINICAL DATA:  Shortness of breath. EXAM: PORTABLE CHEST 1 VIEW COMPARISON:  None. FINDINGS: The heart size and mediastinal contours are within normal limits. Both lungs are clear. The visualized skeletal structures are unremarkable. IMPRESSION: No active disease. Electronically Signed   By:  Dorise Bullion III M.D   On: 09/13/2019 16:00    Procedures Procedures (including critical care time)  Medications Ordered in ED Medications  albuterol (VENTOLIN HFA) 108 (90 Base) MCG/ACT inhaler 2 puff (has no administration in time range)  AeroChamber Plus Flo-Vu Large MISC 1 each (has no administration in time range)    ED Course  I have reviewed the triage vital signs and the nursing notes.  Pertinent labs & imaging results that were available during my care of the patient were reviewed by me and considered in my medical decision making (see chart for details).  Brena Windsor was evaluated in Emergency Department on 09/13/2019 for the symptoms described in the history of present illness. He/she was evaluated in the context of the global COVID-19 pandemic, which necessitated consideration that the patient might be at risk for infection with the SARS-CoV-2 virus that causes COVID-19. Institutional protocols and algorithms that pertain to the evaluation of patients at risk for COVID-19 are in a state of rapid change based on information released by regulatory bodies including the CDC and federal and state organizations. These policies and algorithms were followed during the patient's care in the ED.    MDM Rules/Calculators/A&P                       Patient with positive covid 19 testing presents to the ED with complaints of intermittent dyspnea. Nontoxic, initial tachycardia resolved throughout ED stay, vitals otherwise without significant abnormality. No meningismus. No signs of AOM/AOE/mastoiditis. Oropharynx clear. Lungs CTA. CXR w/o fluid overload or pneumothorax, also no signs of pneumonia. EKG without STEMI or arrhythmia, patient without chest pain. Low risk wells- doubt PE. Suspect sxs secondary to COVID 19. Ambulatory SpO2 maintaining @ 98-100%, not in respiratory distress, does not appear to require admission. Will discharge with albuterol inhaler provided in the ED. I discussed results, treatment plan, need for follow-up, and return precautions with the patient. Provided opportunity for questions, patient confirmed understanding and is in agreement with plan.   Blood pressure 121/72, pulse 86, temperature 98.2 F (36.8 C), temperature source Oral, resp. rate 15, SpO2 100 %.  Final Clinical Impression(s) / ED Diagnoses Final diagnoses:  COVID-19  SOB (shortness of breath)    Rx / DC Orders ED Discharge Orders    None       Amaryllis Dyke, PA-C 09/14/19 [place], DO 09/14/19 1031

## 2019-09-13 NOTE — ED Notes (Signed)
Patient verbalizes understanding of discharge instructions. Opportunity for questioning and answers were provided. Armband removed by staff, pt discharged from ED.  

## 2019-12-17 ENCOUNTER — Emergency Department (HOSPITAL_COMMUNITY)
Admission: EM | Admit: 2019-12-17 | Discharge: 2019-12-17 | Disposition: A | Discharge status: Home / Self Care | Payer: Medicaid Other | Attending: Emergency Medicine | Admitting: Emergency Medicine

## 2019-12-17 ENCOUNTER — Other Ambulatory Visit: Payer: Self-pay

## 2019-12-17 ENCOUNTER — Encounter (HOSPITAL_COMMUNITY): Payer: Self-pay | Admitting: Emergency Medicine

## 2019-12-17 DIAGNOSIS — K0889 Other specified disorders of teeth and supporting structures: Secondary | ICD-10-CM

## 2019-12-17 DIAGNOSIS — Z79899 Other long term (current) drug therapy: Secondary | ICD-10-CM | POA: Insufficient documentation

## 2019-12-17 DIAGNOSIS — K029 Dental caries, unspecified: Secondary | ICD-10-CM | POA: Insufficient documentation

## 2019-12-17 MED ORDER — AMOXICILLIN-POT CLAVULANATE 875-125 MG PO TABS: 1.0000 | ORAL_TABLET | Freq: Two times a day (BID) | ORAL | 0 refills | Status: AC

## 2019-12-17 MED ORDER — CHLORHEXIDINE GLUCONATE 0.12 % MT SOLN: 15.0000 mL | Freq: Two times a day (BID) | OROMUCOSAL | 0 refills | Status: AC

## 2019-12-17 NOTE — ED Provider Notes (Addendum)
[facility] EMERGENCY DEPARTMENT Provider Note   CSN: [id number] Arrival date & time: 12/17/19  7408     History Chief Complaint  Patient presents with  . Dental Pain    Brianna Floyd is a 39 y.o. female.  HPI 39 year old femalresents to the ED with complaints of tooth pain.  Pain is in her right lower jaw/tooth which began last week.  Patient states she has a history of dental pain, states she is "just here for the penicillin so I can go".  She has some pain in her right lower tooth and along the gumline.   Patient denies any associated difficulty swallowing, difficulties breathing, fevers, facial swelling, drooling.  She does not have a dentist due to not having insurance.  She denies any associated lightheadedness, dizziness, vision changes, neck pain. Patient has not tried anything for her symptoms prior to arrival. Nothing makes it  better. Eating and drinking makes the pain worse.     Past Medical History:     Past Medical History:  Diagnosis Date  . Back ache     There are no problems to display for this patient.   Past Surgical History:  Procedure Laterality Date  . TUBAL LIGATION       OB History   No obstetric history on file.     No family history on file.  Social History   Tobacco Use  . Smoking status: Never Smoker  . Smokeless tobacco: Never Used  Substance Use Topics  . Alcohol use: No  . Drug use: Yes    Types: Marijuana    Home Medications Prior to Admission medications   Medication Sig Start Date End Date Taking? Authorizing Provider  acetaminophen (TYLENOL) 500 MG tablet Take 1 tablet (500 mg total) by mouth every 6 (six) hours as needed. 02/12/17   Law, Bea Graff, PA-C  amoxicillin-clavulanate (AUGMENTIN) 875-125 MG tablet Take 1 tablet by mouth every 12 (twelve) hours. 12/17/19   Garald Balding, PA-C  cephALEXin (KEFLEX) 500 MG capsule Take 1 capsule (500 mg total) by mouth 2 (two) times daily. 02/12/17   Law, Bea Graff, PA-C  chlorhexidine (PERIDEX) 0.12 % solution Use as directed 15 mLs in the mouth or throat 2 (two) times daily. 12/17/19   Garald Balding, PA-C  clindamycin (CLEOCIN) 300 MG capsule Take 1 capsule (300 mg total) by mouth 3 (three) times daily. 05/05/17   Doristine Devoid, PA-C  ibuprofen (ADVIL,MOTRIN) 600 MG tablet Take 1 tablet (600 mg total) by mouth every 6 (six) hours as needed. 02/12/17   Law, Bea Graff, PA-C  lidocaine (XYLOCAINE) 2 % solution Use as directed 20 mLs in the mouth or throat as needed for mouth pain. 05/05/17   Doristine Devoid, PA-C  methocarbamol (ROBAXIN) 500 MG tablet Take 1 tablet (500 mg total) by mouth 2 (two) times daily. 02/12/17   Law, Bea Graff, PA-C  predniSONE (DELTASONE) 20 MG tablet Take 2 tablets (40 mg total) by mouth daily with breakfast. 02/12/17   Law, Bea Graff, PA-C  traMADol (ULTRAM) 50 MG tablet Take 1 tablet (50 mg total) by mouth every 6 (six) hours as needed. 05/05/17   Doristine Devoid, PA-C    Allergies    Patient has no known allergies.  Review of Systems   Review of Systems  Constitutional: Negative for chills and fever.  HENT: Positive for dental problem. Negative for drooling, ear pain, facial swelling, mouth sores, sinus pain, sore throat, trouble swallowing  and voice change.   Musculoskeletal: Negative for myalgias, neck pain and neck stiffness.    Physical Exam Updated Vital Signs BP (!) 124/100 (BP Location: Left Arm)   Pulse 86   Temp 99 F (37.2 C)   Resp 16   SpO2 100%   Physical Exam Vitals and nursing note reviewed.  Constitutional:      General: She is not in acute distress.    Appearance: Normal appearance. She is well-developed. She is not ill-appearing, toxic-appearing or diaphoretic.  HENT:     Head: Normocephalic and atraumatic.     Nose: Nose normal.     Mouth/Throat:     Lips: No lesions.     Mouth: Mucous membranes are moist. No oral lesions.     Dentition: Abnormal dentition. Does not have  dentures. Dental tenderness and dental caries present. No gingival swelling, dental abscesses or gum lesions.     Tongue: No lesions. Tongue does not deviate from midline.     Palate: No mass.     Pharynx: Oropharynx is clear. No pharyngeal swelling, oropharyngeal exudate or posterior oropharyngeal erythema.     Tonsils: No tonsillar exudate or tonsillar abscesses.     Comments: Moderately poor dentition, multiple missing teeth.  Right lower molar with evidence of cavity and small hole.  Tenderness to palpation over affected tooth.  No evidence of erythema around the gumline.  No fluctuance, drainage, evidence of abscess.  No sublingual swelling, uvula midline, no tonsillar exudates.  Tongue normal.  No parotid swelling. Eyes:     Extraocular Movements: Extraocular movements intact.     Conjunctiva/sclera: Conjunctivae normal.     Pupils: Pupils are equal, round, and reactive to light.  Cardiovascular:     Rate and Rhythm: Normal rate and regular rhythm.     Heart sounds: No murmur.  Pulmonary:     Effort: Pulmonary effort is normal. No respiratory distress.     Breath sounds: Normal breath sounds.  Abdominal:     General: Abdomen is flat.     Palpations: Abdomen is soft.     Tenderness: There is no abdominal tenderness.  Musculoskeletal:        General: No swelling or tenderness. Normal range of motion.     Cervical back: Normal range of motion and neck supple. No rigidity or tenderness.  Skin:    General: Skin is warm and dry.  Neurological:     General: No focal deficit present.     Mental Status: She is alert and oriented to person, place, and time.  Psychiatric:        Mood and Affect: Mood normal.        Behavior: Behavior normal.     ED Results / Procedures / Treatments   Labs (all labs ordered are listed, but only abnormal results are displayed) Labs Reviewed - No data to display  EKG None  Radiology No results found.  Procedures Procedures (including critical  care time)  Medications Ordered in ED Medications - No data to display  ED Course  I have reviewed the triage vital signs and the nursing notes.  Pertinent labs & imaging results that were available during my care of the patient were reviewed by me and considered in my medical decision making (see chart for details).    MDM Rules/Calculators/A&P                      Patient with toothache.  No gross abscess.  Exam  unconcerning for Ludwig's angina or spread of infection.  Will treat with augmentin, peridex and instructed to take over-the-counter anti-inflammatories.  Urged patient to follow-up with dentist.  Referral provided.  Patient does not have a PCP either, provided [facility] referral information and reached out to transition of care team.  Return precautions given.  Patient voices understanding and is agreeable to this plan.  At this stage in the ED course, the patient has been adequately screened and is stable for discharge.  Final Clinical Impression(s) / ED Diagnoses Final diagnoses:  Pain, dental    Rx / DC Orders ED Discharge Orders         Ordered    amoxicillin-clavulanate (AUGMENTIN) 875-125 MG tablet  Every 12 hours     12/17/19 0934    chlorhexidine (PERIDEX) 0.12 % solution  2 times daily     12/17/19 0816               Mare Ferrari, PA-C 12/17/19 1008    Pricilla Loveless, MD 12/21/19 [phone]

## 2019-12-17 NOTE — ED Triage Notes (Signed)
Pt reports dental pain (right lower) x2 days, states that she had noticed the tooth had a hole in it a while back and it has gotten larger, does not have a dentist right now due to loss of insurance.

## 2019-12-17 NOTE — Discharge Instructions (Addendum)
Please take antibiotic as prescribed. Use mouth wash twice a day.  I have provided a referral to a dentist in the [place] area, please make sure to call the number and establish with them.  I have also reached out to our transition of care team to help you establish with a primary care doctor even without insurance.  I have also provided the contact information for [facility] which is a free clinic.  Return to the ER if your symptoms worsen.
